# Patient Record
Sex: Female | Born: 1975 | Race: White | Hispanic: No | Marital: Married | Smoking: Never smoker
Health system: Southern US, Community
[De-identification: ages and names within clinical notes are randomized; demographics above are authoritative.]

---

## 2002-05-20 DIAGNOSIS — O1490 Unspecified pre-eclampsia, unspecified trimester: Secondary | ICD-10-CM

## 2005-04-27 DIAGNOSIS — O1490 Unspecified pre-eclampsia, unspecified trimester: Secondary | ICD-10-CM

## 2006-12-06 DIAGNOSIS — O24419 Gestational diabetes mellitus in pregnancy, unspecified control: Secondary | ICD-10-CM

## 2010-05-30 DIAGNOSIS — O24419 Gestational diabetes mellitus in pregnancy, unspecified control: Secondary | ICD-10-CM

## 2016-09-01 ENCOUNTER — Other Ambulatory Visit (HOSPITAL_COMMUNITY)
Admission: RE | Admit: 2016-09-01 | Discharge: 2016-09-01 | Disposition: A | Discharge status: Home / Self Care | Payer: 59 | Source: Ambulatory Visit | Attending: Obstetrics & Gynecology | Admitting: Obstetrics & Gynecology

## 2016-09-01 ENCOUNTER — Other Ambulatory Visit: Payer: Self-pay | Admitting: Obstetrics & Gynecology

## 2016-09-01 DIAGNOSIS — Z1151 Encounter for screening for human papillomavirus (HPV): Secondary | ICD-10-CM | POA: Insufficient documentation

## 2016-09-01 DIAGNOSIS — Z01419 Encounter for gynecological examination (general) (routine) without abnormal findings: Secondary | ICD-10-CM | POA: Insufficient documentation

## 2016-09-05 LAB — CYTOLOGY - PAP
DIAGNOSIS: NEGATIVE
HPV: NOT DETECTED

## 2016-09-13 ENCOUNTER — Other Ambulatory Visit: Payer: Self-pay | Admitting: Family Medicine

## 2016-09-13 DIAGNOSIS — Z1231 Encounter for screening mammogram for malignant neoplasm of breast: Secondary | ICD-10-CM

## 2016-10-11 ENCOUNTER — Ambulatory Visit
Admission: RE | Admit: 2016-10-11 | Discharge: 2016-10-11 | Disposition: A | Discharge status: Home / Self Care | Payer: 59 | Source: Ambulatory Visit | Attending: Family Medicine | Admitting: Family Medicine

## 2016-10-11 DIAGNOSIS — Z1231 Encounter for screening mammogram for malignant neoplasm of breast: Secondary | ICD-10-CM

## 2017-09-11 ENCOUNTER — Other Ambulatory Visit: Payer: Self-pay | Admitting: Family Medicine

## 2017-09-11 DIAGNOSIS — Z1231 Encounter for screening mammogram for malignant neoplasm of breast: Secondary | ICD-10-CM

## 2017-10-12 ENCOUNTER — Ambulatory Visit
Admission: RE | Admit: 2017-10-12 | Discharge: 2017-10-12 | Disposition: A | Discharge status: Home / Self Care | Payer: 59 | Source: Ambulatory Visit | Attending: Family Medicine | Admitting: Family Medicine

## 2017-10-12 DIAGNOSIS — Z1231 Encounter for screening mammogram for malignant neoplasm of breast: Secondary | ICD-10-CM

## 2018-09-30 ENCOUNTER — Other Ambulatory Visit: Payer: Self-pay | Admitting: Family Medicine

## 2018-09-30 DIAGNOSIS — R221 Localized swelling, mass and lump, neck: Secondary | ICD-10-CM

## 2018-10-08 ENCOUNTER — Other Ambulatory Visit: Payer: Self-pay

## 2018-10-08 ENCOUNTER — Ambulatory Visit
Admission: RE | Admit: 2018-10-08 | Discharge: 2018-10-08 | Disposition: A | Discharge status: Home / Self Care | Payer: 59 | Source: Ambulatory Visit | Attending: Family Medicine | Admitting: Family Medicine

## 2018-10-08 ENCOUNTER — Other Ambulatory Visit: Payer: Self-pay | Admitting: Family Medicine

## 2018-10-08 DIAGNOSIS — R221 Localized swelling, mass and lump, neck: Secondary | ICD-10-CM

## 2019-08-29 ENCOUNTER — Ambulatory Visit: Payer: 59 | Attending: Internal Medicine

## 2019-08-29 DIAGNOSIS — Z23 Encounter for immunization: Secondary | ICD-10-CM

## 2019-08-29 NOTE — Progress Notes (Signed)
   Covid-19 Vaccination Clinic  Name:  Katherene Dinino    MRN: [id number] DOB: November 19, 1975  08/29/2019  Ms. Hafen was observed post Covid-19 immunization for 15 minutes without incident. She was provided with Vaccine Information Sheet and instruction to access the V-Safe system.   Ms. Ehrler was instructed to call 911 with any severe reactions post vaccine: Marland Kitchen Difficulty breathing  . Swelling of face and throat  . A fast heartbeat  . A bad rash all over body  . Dizziness and weakness   Immunizations Administered    Name Date Dose VIS Date Route   Pfizer COVID-19 Vaccine 08/29/2019  8:39 AM 0.3 mL 05/23/2019 Intramuscular   Manufacturer: [employer], [employer]   Lot: [id number]   NDC: [address]-0

## 2019-09-23 ENCOUNTER — Ambulatory Visit: Payer: 59 | Attending: Internal Medicine

## 2019-09-23 DIAGNOSIS — Z23 Encounter for immunization: Secondary | ICD-10-CM

## 2019-09-23 NOTE — Progress Notes (Signed)
   Covid-19 Vaccination Clinic  Name:  [place] Huberty    MRN: [id number] DOB: 1975/10/02  09/23/2019  Ms. Kestner was observed post Covid-19 immunization for 15 minutes without incident. She was provided with Vaccine Information Sheet and instruction to access the V-Safe system.   Ms. Qazi was instructed to call 911 with any severe reactions post vaccine: Marland Kitchen Difficulty breathing  . Swelling of face and throat  . A fast heartbeat  . A bad rash all over body  . Dizziness and weakness   Immunizations Administered    Name Date Dose VIS Date Route   Pfizer COVID-19 Vaccine 09/23/2019  9:15 AM 0.3 mL 05/23/2019 Intramuscular   Manufacturer: [employer], [employer]   Lot: [id number]   NDC: [address]-5

## 2019-11-18 ENCOUNTER — Other Ambulatory Visit: Payer: Self-pay | Admitting: Family Medicine

## 2019-11-18 DIAGNOSIS — Z1231 Encounter for screening mammogram for malignant neoplasm of breast: Secondary | ICD-10-CM

## 2019-11-28 ENCOUNTER — Ambulatory Visit
Admission: RE | Admit: 2019-11-28 | Discharge: 2019-11-28 | Disposition: A | Discharge status: Home / Self Care | Payer: 59 | Source: Ambulatory Visit

## 2019-11-28 ENCOUNTER — Other Ambulatory Visit: Payer: Self-pay

## 2019-11-28 DIAGNOSIS — Z1231 Encounter for screening mammogram for malignant neoplasm of breast: Secondary | ICD-10-CM

## 2020-05-15 ENCOUNTER — Other Ambulatory Visit: Payer: Self-pay

## 2020-05-15 ENCOUNTER — Ambulatory Visit: Payer: 59 | Attending: Internal Medicine

## 2020-05-15 DIAGNOSIS — Z23 Encounter for immunization: Secondary | ICD-10-CM

## 2020-05-15 NOTE — Progress Notes (Signed)
   Covid-19 Vaccination Clinic  Name:  Shakiah Wester    MRN: [id number] DOB: [birth year]  05/15/2020  Ms. Rudden was observed post Covid-19 immunization for 15 minutes without incident. She was provided with Vaccine Information Sheet and instruction to access the V-Safe system.   Ms. Bureau was instructed to call 911 with any severe reactions post vaccine: Marland Kitchen Difficulty breathing  . Swelling of face and throat  . A fast heartbeat  . A bad rash all over body  . Dizziness and weakness   Immunizations Administered    Name Date Dose VIS Date Route   Pfizer COVID-19 Vaccine 05/15/2020  9:39 AM 0.3 mL 03/31/2020 Intramuscular   Manufacturer: [employer], [employer]   Lot: [id number]   NDC: [address]-7

## 2020-05-26 LAB — LAB REPORT - SCANNED
A1c: 5.9
TSH: 3.64 (ref 0.41–5.90)

## 2020-12-09 ENCOUNTER — Other Ambulatory Visit: Payer: Self-pay | Admitting: Family Medicine

## 2020-12-09 DIAGNOSIS — Z1231 Encounter for screening mammogram for malignant neoplasm of breast: Secondary | ICD-10-CM

## 2021-02-01 ENCOUNTER — Other Ambulatory Visit: Payer: Self-pay

## 2021-02-01 ENCOUNTER — Ambulatory Visit
Admission: RE | Admit: 2021-02-01 | Discharge: 2021-02-01 | Disposition: A | Discharge status: Home / Self Care | Payer: 59 | Source: Ambulatory Visit | Attending: Family Medicine | Admitting: Family Medicine

## 2021-02-01 DIAGNOSIS — Z1231 Encounter for screening mammogram for malignant neoplasm of breast: Secondary | ICD-10-CM

## 2021-12-26 ENCOUNTER — Other Ambulatory Visit: Payer: Self-pay | Admitting: Family Medicine

## 2021-12-26 DIAGNOSIS — Z1231 Encounter for screening mammogram for malignant neoplasm of breast: Secondary | ICD-10-CM

## 2022-01-01 IMAGING — MG MM DIGITAL SCREENING BILAT W/ TOMO AND CAD
8 series · 9 of 24 positions shown · non-contrast
Comparison: Previous exam(s).

CLINICAL DATA: Screening.

EXAM:
DIGITAL SCREENING BILATERAL MAMMOGRAM WITH TOMOSYNTHESIS AND CAD
TECHNIQUE: Bilateral screening digital craniocaudal and mediolateral oblique
mammograms were obtained. Bilateral screening digital breast
tomosynthesis was performed. The images were evaluated with
computer-aided detection.

[L CC synth-2D]
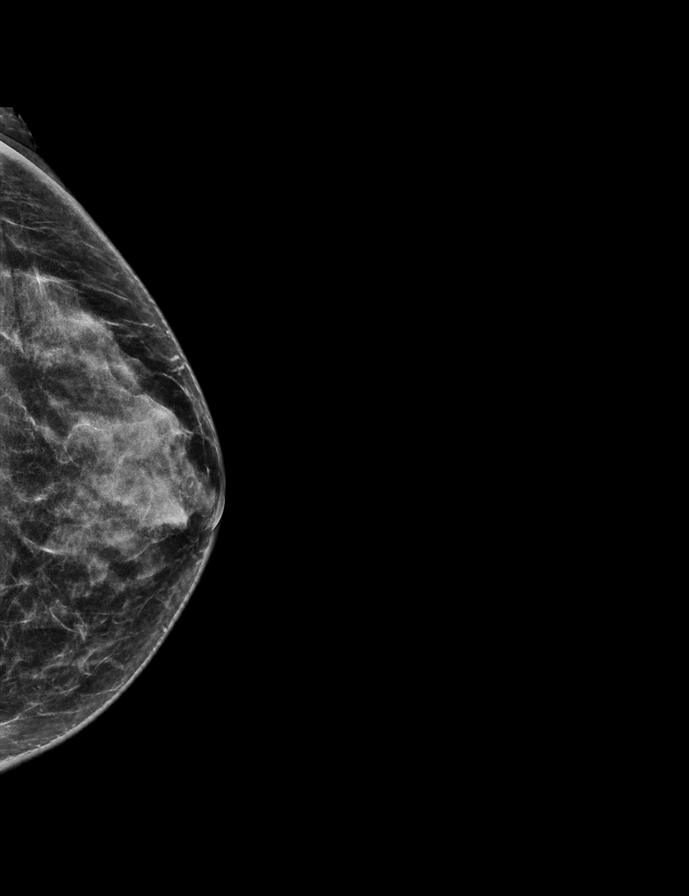

[R CC synth-2D]
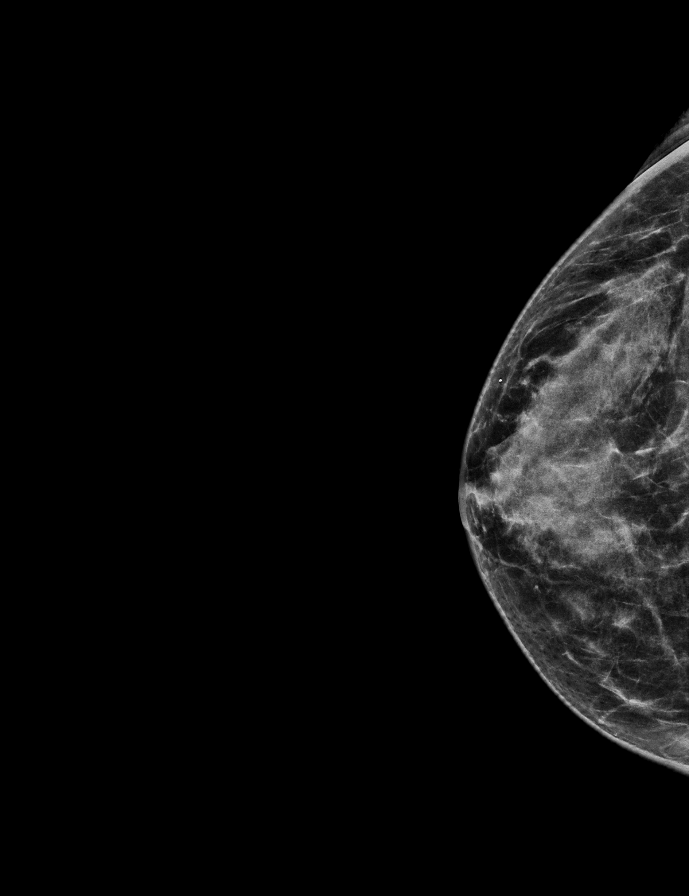

[L MLO synth-2D]
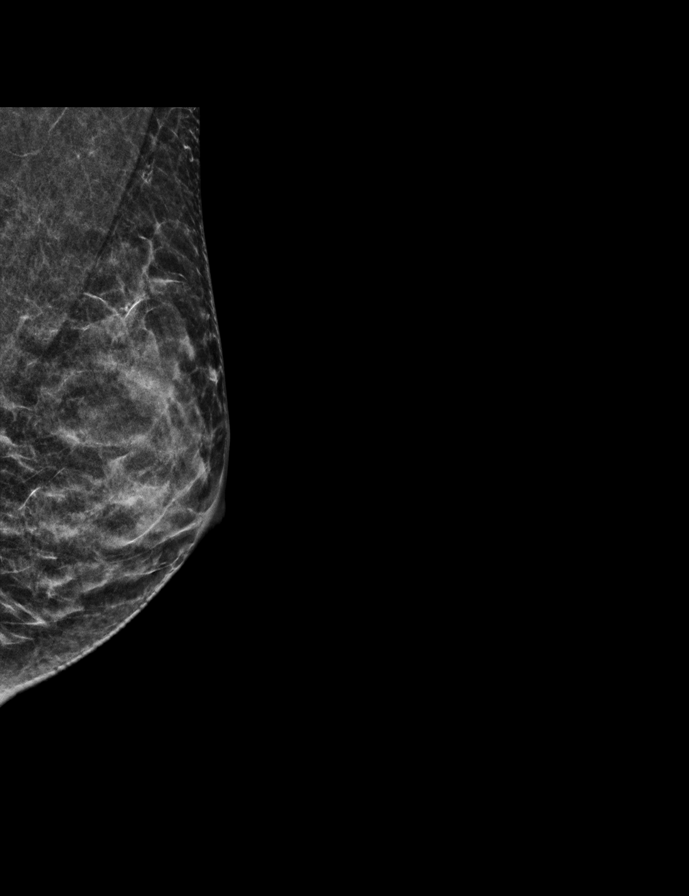

[R MLO synth-2D]
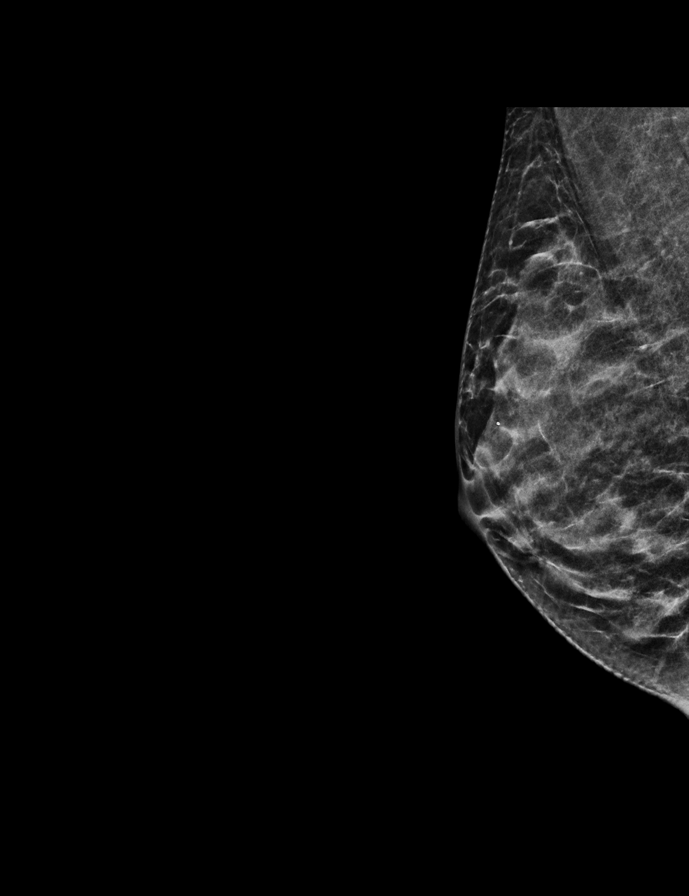

[L MLO tomo · 2 of 44 frames shown]
[frame 15/44]
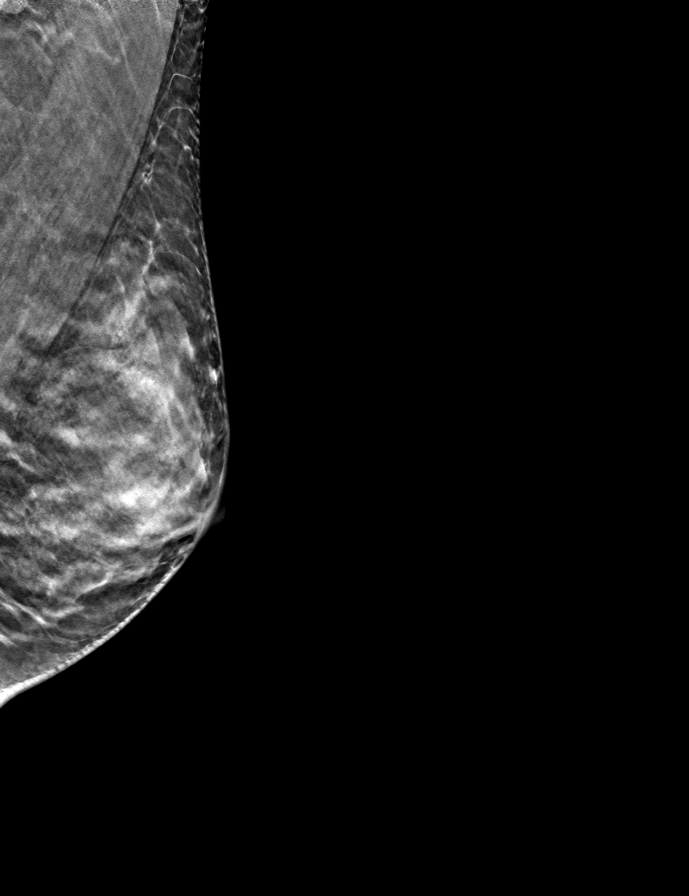
[frame 23/44]
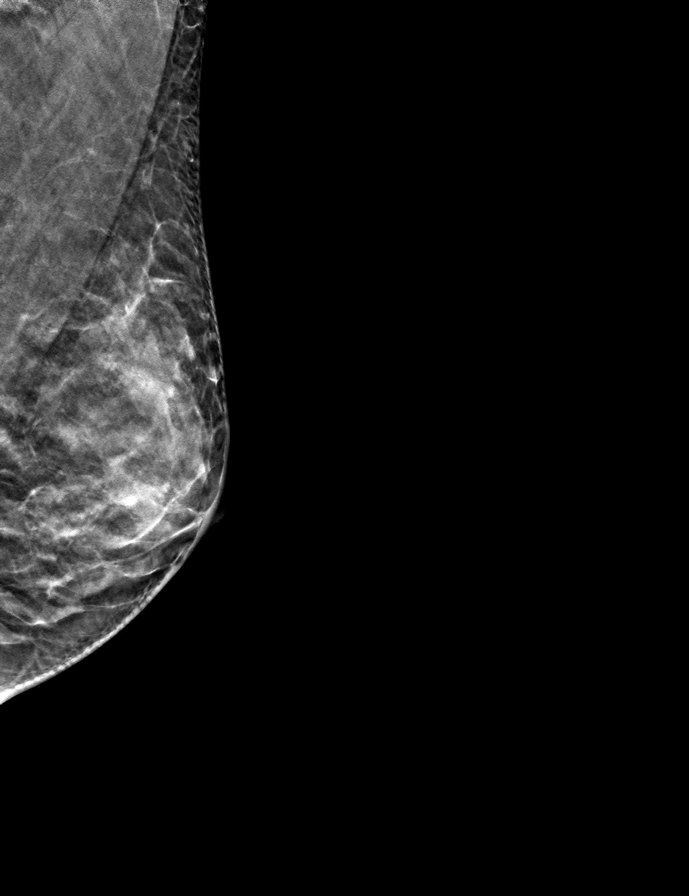

[R MLO tomo · tomo slice 23/45.0]
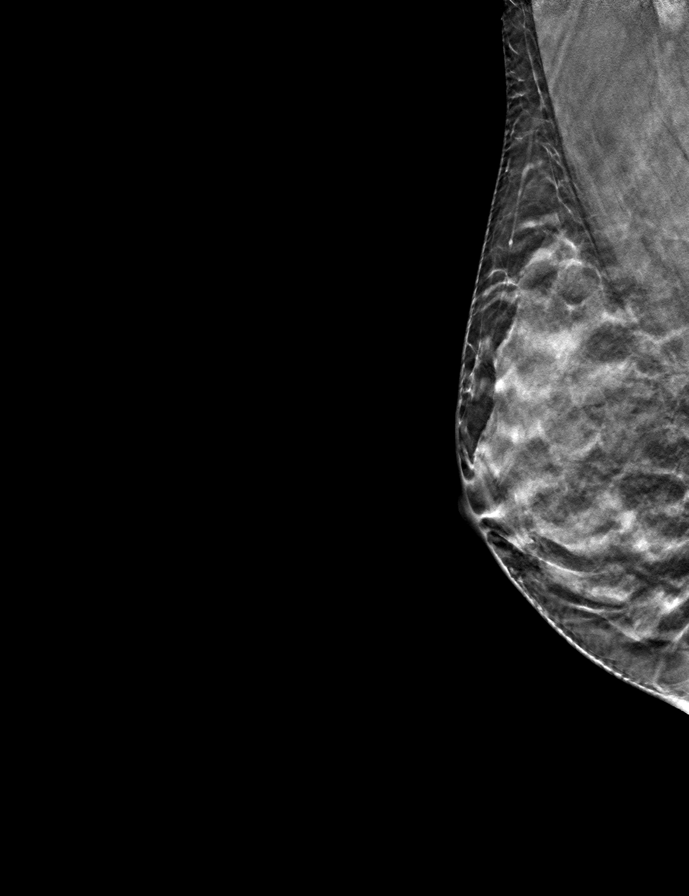

[R CC tomo · tomo slice 26/51.0]
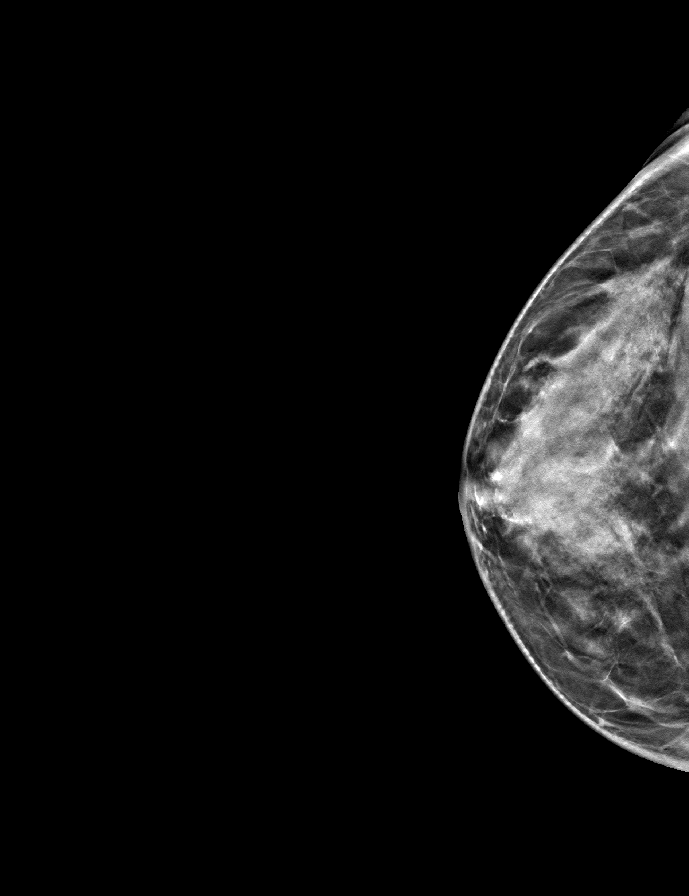

[L CC tomo · tomo slice 25/50.0]
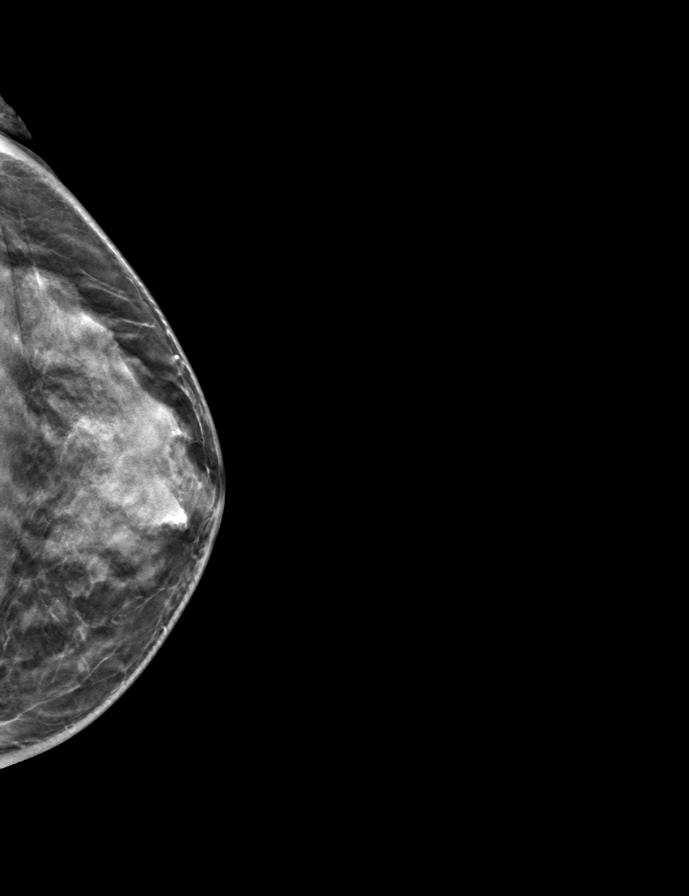

[9 of 24 positions shown; findings below may reference images not displayed]

ACR Breast Density Category c: The breast tissue is heterogeneously
dense, which may obscure small masses.
FINDINGS: There are no findings suspicious for malignancy.
IMPRESSION: No mammographic evidence of malignancy. A result letter of this
screening mammogram will be mailed directly to the patient.

RECOMMENDATION:
Screening mammogram in one year. (Code:[id number])

BI-RADS CATEGORY  1: Negative.

## 2022-02-03 ENCOUNTER — Ambulatory Visit
Admission: RE | Admit: 2022-02-03 | Discharge: 2022-02-03 | Disposition: A | Discharge status: Home / Self Care | Payer: 59 | Source: Ambulatory Visit | Attending: Family Medicine | Admitting: Family Medicine

## 2022-02-03 DIAGNOSIS — Z1231 Encounter for screening mammogram for malignant neoplasm of breast: Secondary | ICD-10-CM

## 2022-08-28 LAB — COLOGUARD: Cologuard: NEGATIVE

## 2023-01-05 ENCOUNTER — Other Ambulatory Visit: Payer: Self-pay | Admitting: Family Medicine

## 2023-01-05 DIAGNOSIS — Z1231 Encounter for screening mammogram for malignant neoplasm of breast: Secondary | ICD-10-CM

## 2023-02-06 ENCOUNTER — Ambulatory Visit: Admission: RE | Admit: 2023-02-06 | Payer: 59 | Source: Ambulatory Visit

## 2023-02-06 DIAGNOSIS — Z1231 Encounter for screening mammogram for malignant neoplasm of breast: Secondary | ICD-10-CM

## 2023-02-07 LAB — HM MAMMOGRAPHY

## 2023-07-23 ENCOUNTER — Ambulatory Visit (INDEPENDENT_AMBULATORY_CARE_PROVIDER_SITE_OTHER): Payer: 59 | Admitting: Family Medicine

## 2023-07-23 ENCOUNTER — Encounter: Payer: Self-pay | Admitting: Family Medicine

## 2023-07-23 VITALS — BP 122/84 | HR 80 | Temp 97.6°F | Ht 61.0 in | Wt 144.0 lb

## 2023-07-23 DIAGNOSIS — F411 Generalized anxiety disorder: Secondary | ICD-10-CM

## 2023-07-23 DIAGNOSIS — Z6827 Body mass index (BMI) 27.0-27.9, adult: Secondary | ICD-10-CM | POA: Diagnosis not present

## 2023-07-23 DIAGNOSIS — Z808 Family history of malignant neoplasm of other organs or systems: Secondary | ICD-10-CM | POA: Diagnosis not present

## 2023-07-23 DIAGNOSIS — E559 Vitamin D deficiency, unspecified: Secondary | ICD-10-CM | POA: Diagnosis not present

## 2023-07-23 DIAGNOSIS — T781XXA Other adverse food reactions, not elsewhere classified, initial encounter: Secondary | ICD-10-CM

## 2023-07-23 DIAGNOSIS — Z0000 Encounter for general adult medical examination without abnormal findings: Secondary | ICD-10-CM

## 2023-07-23 DIAGNOSIS — E663 Overweight: Secondary | ICD-10-CM

## 2023-07-23 LAB — LIPID PANEL
Cholesterol: 202 mg/dL — ABNORMAL HIGH (ref 0–200)
HDL: 79.8 mg/dL (ref >39.00)
LDL Cholesterol: 107 mg/dL — ABNORMAL HIGH (ref 0–99)
NonHDL: 121.97
Total CHOL/HDL Ratio: 3
Triglycerides: 75 mg/dL (ref 0.0–149.0)
VLDL: 15 mg/dL (ref 0.0–40.0)

## 2023-07-23 LAB — COMPREHENSIVE METABOLIC PANEL
ALT: 16 U/L (ref 0–35)
AST: 24 U/L (ref 0–37)
Albumin: 4.3 g/dL (ref 3.5–5.2)
Alkaline Phosphatase: 51 U/L (ref 39–117)
BUN: 11 mg/dL (ref 6–23)
CO2: 26 meq/L (ref 19–32)
Calcium: 9.3 mg/dL (ref 8.4–10.5)
Chloride: 101 meq/L (ref 96–112)
Creatinine, Ser: 0.85 mg/dL (ref 0.40–1.20)
GFR: 80.41 mL/min (ref >60.00)
Glucose, Bld: 103 mg/dL — ABNORMAL HIGH (ref 70–99)
Potassium: 4 meq/L (ref 3.5–5.1)
Sodium: 138 meq/L (ref 135–145)
Total Bilirubin: 0.6 mg/dL (ref 0.2–1.2)
Total Protein: 7.5 g/dL (ref 6.0–8.3)

## 2023-07-23 LAB — CBC WITH DIFFERENTIAL/PLATELET
Basophils Absolute: 0 10*3/uL (ref 0.0–0.1)
Basophils Relative: 0.7 % (ref 0.0–3.0)
Eosinophils Absolute: 0.1 10*3/uL (ref 0.0–0.7)
Eosinophils Relative: 1.3 % (ref 0.0–5.0)
HCT: 45.7 % (ref 36.0–46.0)
Hemoglobin: 15.2 g/dL — ABNORMAL HIGH (ref 12.0–15.0)
Lymphocytes Relative: 32.3 % (ref 12.0–46.0)
Lymphs Abs: 1.8 10*3/uL (ref 0.7–4.0)
MCHC: 33.2 g/dL (ref 30.0–36.0)
MCV: 92.1 fL (ref 78.0–100.0)
Monocytes Absolute: 0.4 10*3/uL (ref 0.1–1.0)
Monocytes Relative: 7.6 % (ref 3.0–12.0)
Neutro Abs: 3.3 10*3/uL (ref 1.4–7.7)
Neutrophils Relative %: 58.1 % (ref 43.0–77.0)
Platelets: 364 10*3/uL (ref 150.0–400.0)
RBC: 4.96 Mil/uL (ref 3.87–5.11)
RDW: 13.6 % (ref 11.5–15.5)
WBC: 5.7 10*3/uL (ref 4.0–10.5)

## 2023-07-23 LAB — VITAMIN B12: Vitamin B-12: 338 pg/mL (ref 211–911)

## 2023-07-23 LAB — VITAMIN D 25 HYDROXY (VIT D DEFICIENCY, FRACTURES): VITD: 45.56 ng/mL (ref 30.00–100.00)

## 2023-07-23 LAB — TSH: TSH: 2.24 u[IU]/mL (ref 0.35–5.50)

## 2023-07-23 NOTE — Progress Notes (Signed)
 New Patient Office Visit  Subjective    Patient ID: Kaylee Ramsey, female    DOB: 09-Jun-1976  Age: 48 y.o. MRN: [id number]  CC:  Chief Complaint  Patient presents with   Establish Care    No concerns, just following up   Sore throat started this morning     HPI [name] presents to establish care today. She was a patient of mine at previous practice Takes multiple dietary supplements to help with multiple food sensitivities. She also sees Dr Lauralee Poll with Functional Medicine to address her health as well.  Reports sore throat today, her children have had a viral illness recently.  Has not attempted to treat at home. She has hx GAD.  She is fasting today. She is up to date on routine vaccines  She is up to date on routine screenings.  She sees the eye doctor and dentist regularly. She exercises regularly. Reports that she is eating well, sleeping well, feeling well overall.  Denies other concerns today. Medical hx as outlined below.  Outpatient Encounter Medications as of 07/23/2023  Medication Sig   DIGESTIVE ENZYMES PO Take by mouth.   LORazepam (ATIVAN) 0.5 MG tablet Take 0.5 mg by mouth every 8 (eight) hours as needed for anxiety.   MAGNESIUM PO Take by mouth.   Multiple Vitamin (MULTIVITAMIN) capsule Take 1 capsule by mouth daily.   Omega-3 Fatty Acids (FISH OIL PO) Take by mouth.   No facility-administered encounter medications on file as of 07/23/2023.    History reviewed. No pertinent past medical history.  History reviewed. No pertinent surgical history.  Family History  Problem Relation Age of Onset   Breast cancer Mother 22    Social History   Socioeconomic History   Marital status: Married    Spouse name: Not on file   Number of children: Not on file   Years of education: Not on file   Highest education level: Not on file  Occupational History   Not on file  Tobacco Use   Smoking status: Never   Smokeless tobacco: Never  Substance and  Sexual Activity   Alcohol use: Not Currently   Drug use: Never   Sexual activity: Yes  Other Topics Concern   Not on file  Social History Narrative   Not on file   Social Drivers of Health   Financial Resource Strain: Not on file  Food Insecurity: Not on file  Transportation Needs: Not on file  Physical Activity: Not on file  Stress: Not on file  Social Connections: Not on file  Intimate Partner Violence: Not on file    ROS Per HPI      Objective    BP 122/84 (BP Location: Left Arm, Patient Position: Sitting)   Pulse 80   Temp 97.6 F (36.4 C) (Temporal)   Ht 5\' 1"  (1.549 m)   Wt 144 lb (65.3 kg)   SpO2 98%   BMI 27.21 kg/m   Physical Exam Vitals and nursing note reviewed.  Constitutional:      General: She is not in acute distress.    Appearance: Normal appearance. She is normal weight.  HENT:     Head: Normocephalic and atraumatic.     Right Ear: Tympanic membrane and ear canal normal.     Left Ear: Tympanic membrane and ear canal normal.     Nose: Nose normal.     Mouth/Throat:     Mouth: Mucous membranes are moist.     Pharynx: Oropharynx  is clear.  Eyes:     Extraocular Movements: Extraocular movements intact.     Conjunctiva/sclera: Conjunctivae normal.     Pupils: Pupils are equal, round, and reactive to light.  Neck:     Thyroid : No thyromegaly.  Cardiovascular:     Rate and Rhythm: Normal rate and regular rhythm.     Pulses: Normal pulses.     Heart sounds: Normal heart sounds.  Pulmonary:     Effort: Pulmonary effort is normal. No respiratory distress.     Breath sounds: Normal breath sounds. No wheezing, rhonchi or rales.  Abdominal:     General: Abdomen is flat. Bowel sounds are normal. There is no distension.     Palpations: Abdomen is soft.     Tenderness: There is no abdominal tenderness. There is no guarding.  Genitourinary:    Comments: Declines  Musculoskeletal:        General: Normal range of motion.     Cervical back: Normal  range of motion and neck supple.  Lymphadenopathy:     Cervical: No cervical adenopathy.  Skin:    General: Skin is warm and dry.     Capillary Refill: Capillary refill takes less than 2 seconds.  Neurological:     General: No focal deficit present.     Mental Status: She is alert and oriented to person, place, and time.  Psychiatric:        Mood and Affect: Mood normal.        Behavior: Behavior normal.        Thought Content: Thought content normal.        Assessment & Plan:   Well adult exam  Gastrointestinal food sensitivity -     CBC with Differential/Platelet -     Comprehensive metabolic panel -     TSH -     Vitamin B12  Vitamin D  deficiency -     TSH -     VITAMIN D  25 Hydroxy (Vit-D Deficiency, Fractures)  Family history of thyroid  cancer -     TSH  Overweight with body mass index (BMI) of 27 to 27.9 in adult -     Lipid panel -     TSH  GAD (generalized anxiety disorder)   - Continue lorazepam prn  Return in about 1 year (around 07/22/2024) for physical.   Casimer Clear, FNP

## 2023-07-23 NOTE — Patient Instructions (Addendum)
 Welcome to [facility]!  We have completed your physical today.  We are checking labs today, will be in contact with any results that require further attention  Follow up with me a year for a physical, sooner if needed.

## 2024-01-15 ENCOUNTER — Other Ambulatory Visit: Payer: Self-pay | Admitting: Family Medicine

## 2024-01-15 DIAGNOSIS — Z1231 Encounter for screening mammogram for malignant neoplasm of breast: Secondary | ICD-10-CM

## 2024-02-15 ENCOUNTER — Ambulatory Visit
Admission: RE | Admit: 2024-02-15 | Discharge: 2024-02-15 | Disposition: A | Discharge status: Home / Self Care | Source: Ambulatory Visit

## 2024-02-15 DIAGNOSIS — Z1231 Encounter for screening mammogram for malignant neoplasm of breast: Secondary | ICD-10-CM

## 2024-02-21 ENCOUNTER — Ambulatory Visit: Payer: Self-pay | Admitting: Family Medicine

## 2024-04-10 ENCOUNTER — Ambulatory Visit (INDEPENDENT_AMBULATORY_CARE_PROVIDER_SITE_OTHER): Admitting: Obstetrics & Gynecology

## 2024-04-10 ENCOUNTER — Other Ambulatory Visit (HOSPITAL_COMMUNITY)
Admission: RE | Admit: 2024-04-10 | Discharge: 2024-04-10 | Disposition: A | Source: Ambulatory Visit | Attending: Obstetrics & Gynecology | Admitting: Obstetrics & Gynecology

## 2024-04-10 ENCOUNTER — Encounter: Payer: Self-pay | Admitting: Obstetrics & Gynecology

## 2024-04-10 VITALS — BP 117/69 | HR 71 | Ht 61.0 in | Wt 144.6 lb

## 2024-04-10 DIAGNOSIS — Z1151 Encounter for screening for human papillomavirus (HPV): Secondary | ICD-10-CM

## 2024-04-10 DIAGNOSIS — N959 Unspecified menopausal and perimenopausal disorder: Secondary | ICD-10-CM | POA: Diagnosis not present

## 2024-04-10 DIAGNOSIS — Z124 Encounter for screening for malignant neoplasm of cervix: Secondary | ICD-10-CM | POA: Insufficient documentation

## 2024-04-10 DIAGNOSIS — Z01419 Encounter for gynecological examination (general) (routine) without abnormal findings: Secondary | ICD-10-CM | POA: Diagnosis not present

## 2024-04-10 NOTE — Progress Notes (Signed)
 WELL-WOMAN EXAMINATION Patient name: Kaylee Ramsey MRN [id number]  Date of birth: 19-Jun-1975 Chief Complaint:   Pain  History of Present Illness:   Kaylee Ramsey is a 48 y.o. [phone] female being seen today for a routine well-woman exam.   Last seen by me in 2018 @ [facility]  Still having menses monthly- typically will last for 4-5 days followed by brown spotting for several days- varies each month.    Longstanding history of mittelschmerz syndrome with irregular spotting.  In the past she had tried low-dose COC's, which only made her symptoms psychologically worse.  At this point she is being seen by integrative medicine and doing well with nutrition and lifestyle changes.  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  Notes a watery yellow discharge- been tested for BV in the past.  Denies any symptoms currently.  Denies pelvic or abdominal pain   Patient's last menstrual period was 03/28/2024 (exact date).  The current method of family planning is vasectomy.    Last pap collected today.  Last mammogram: 02/2024. Last colonoscopy: cologuard 2023     04/10/2024   10:09 AM 07/23/2023    9:55 AM  Depression screen PHQ 2/9  Decreased Interest 0 0  Down, Depressed, Hopeless 0 0  PHQ - 2 Score 0 0  Altered sleeping 0   Tired, decreased energy 0   Change in appetite 0   Feeling bad or failure about yourself  0   Trouble concentrating 1   Moving slowly or fidgety/restless 0   Suicidal thoughts 0   PHQ-9 Score 1       Review of Systems:   Pertinent items are noted in HPI Denies any headaches, blurred vision, fatigue, shortness of breath, chest pain, abdominal pain, bowel movements, urination, or intercourse unless otherwise stated above.  Pertinent History Reviewed:  Reviewed past medical,surgical, social and family history.  Reviewed problem list, medications and allergies. Physical Assessment:    Vitals:   04/10/24 0946  BP: 117/69  Pulse: 71  Weight: 144 lb 9.6 oz (65.6 kg)  Height: 5' 1 (1.549 m)  Body mass index is 27.32 kg/m.        Physical Examination:   General appearance - well appearing, and in no distress  Mental status - alert, oriented to person, place, and time  Psych:  She has a normal mood and affect  Skin - warm and dry, normal color, no suspicious lesions noted  Chest - effort normal, all lung fields clear to auscultation bilaterally  Heart - normal rate and regular rhythm  Neck:  midline trachea, no thyromegaly or nodules  Breasts - breasts appear normal, no suspicious masses, no skin or nipple changes or  axillary nodes  Abdomen - soft, nontender, nondistended, no masses or organomegaly  Pelvic - VULVA: normal appearing vulva with no masses, tenderness or lesions  VAGINA: normal appearing vagina with normal color and discharge, no lesions  CERVIX: normal appearing cervix without discharge or lesions, no CMT  Thin prep pap is done with HR HPV cotesting ** LONG NARROW SPECULUM- CERVIX VISUALIZED ANTERIOR VAGINA  UTERUS: uterus is felt to be normal size, shape, consistency and nontender   ADNEXA: No adnexal masses or tenderness noted.   Extremities:  No swelling or varicosities noted  Chaperone: Aleck Blase     Assessment & Plan:  1) Well-Woman Exam -Pap collected, reviewed ASCCP guidelines - Mammogram and colon cancer screening up-to-date  2) perimenopause- Reviewed conservative  management options - [facility] website   No orders of the defined types were placed in this encounter.   Meds: No orders of the defined types were placed in this encounter.   Follow-up: Return in about 1 year (around 04/10/2025) for Annual.   Wymon Swaney, DO Attending Obstetrician & Gynecologist, [facility], [facility]

## 2024-04-15 LAB — CYTOLOGY - PAP
Adequacy: ABSENT
Comment: NEGATIVE
Diagnosis: NEGATIVE
Diagnosis: REACTIVE
High risk HPV: NEGATIVE

## 2024-04-16 ENCOUNTER — Ambulatory Visit: Payer: Self-pay | Admitting: Obstetrics & Gynecology

## 2024-04-16 ENCOUNTER — Encounter: Admitting: Obstetrics & Gynecology

## 2024-04-22 ENCOUNTER — Other Ambulatory Visit (HOSPITAL_BASED_OUTPATIENT_CLINIC_OR_DEPARTMENT_OTHER): Payer: Self-pay

## 2024-04-22 MED ORDER — COMIRNATY 30 MCG/0.3ML IM SUSY
0.3000 mL | PREFILLED_SYRINGE | Freq: Once | INTRAMUSCULAR | 0 refills | Status: AC
  Filled 2024-04-22: qty 0.3, 1d supply, fill #0

## 2024-07-25 ENCOUNTER — Encounter: Admitting: Family Medicine
